# Patient Record
Sex: Female | Born: 1950 | Race: White | Hispanic: No | Marital: Married | Smoking: Never smoker
Health system: Southern US, Community
[De-identification: ages and names within clinical notes are randomized; demographics above are authoritative.]

## PROBLEM LIST (undated history)

## (undated) DIAGNOSIS — C50919 Malignant neoplasm of unspecified site of unspecified female breast: Secondary | ICD-10-CM

## (undated) DIAGNOSIS — I10 Essential (primary) hypertension: Secondary | ICD-10-CM

## (undated) HISTORY — PX: BREAST LUMPECTOMY: SHX2

## (undated) HISTORY — PX: APPENDECTOMY: SHX54

---

## 2015-05-07 ENCOUNTER — Ambulatory Visit
Admission: EM | Admit: 2015-05-07 | Discharge: 2015-05-07 | Disposition: A | Discharge status: Home / Self Care | Payer: No Typology Code available for payment source | Attending: Internal Medicine | Admitting: Internal Medicine

## 2015-05-07 ENCOUNTER — Encounter: Payer: Self-pay | Admitting: Emergency Medicine

## 2015-05-07 ENCOUNTER — Ambulatory Visit: Payer: No Typology Code available for payment source

## 2015-05-07 DIAGNOSIS — Y9301 Activity, walking, marching and hiking: Secondary | ICD-10-CM | POA: Diagnosis not present

## 2015-05-07 DIAGNOSIS — Z853 Personal history of malignant neoplasm of breast: Secondary | ICD-10-CM | POA: Diagnosis not present

## 2015-05-07 DIAGNOSIS — S93601A Unspecified sprain of right foot, initial encounter: Secondary | ICD-10-CM | POA: Diagnosis not present

## 2015-05-07 DIAGNOSIS — E785 Hyperlipidemia, unspecified: Secondary | ICD-10-CM | POA: Diagnosis not present

## 2015-05-07 DIAGNOSIS — I10 Essential (primary) hypertension: Secondary | ICD-10-CM | POA: Insufficient documentation

## 2015-05-07 DIAGNOSIS — M79671 Pain in right foot: Secondary | ICD-10-CM | POA: Diagnosis present

## 2015-05-07 HISTORY — DX: Malignant neoplasm of unspecified site of unspecified female breast: C50.919

## 2015-05-07 HISTORY — DX: Essential (primary) hypertension: I10

## 2015-05-07 MED ORDER — HYDROCODONE-ACETAMINOPHEN 5-325 MG PO TABS: 2.0000 | ORAL_TABLET | ORAL | Status: AC | PRN

## 2015-05-07 MED ORDER — INDOMETHACIN 25 MG PO CAPS: 50.0000 mg | ORAL_CAPSULE | Freq: Three times a day (TID) | ORAL | Status: AC | PRN

## 2015-05-07 NOTE — Discharge Instructions (Signed)
Foot pain seems most like a sprain, but cannot completely [place] out possibility of gout triggered by minor injury. Prescriptions for indocin (anti inflammatory) and small number of vicodin given.   Wear boot for comfort. Recheck if not improving in the next week or so. Xray of the foot was negative for fracture today.  Foot Sprain The muscles and cord like structures which attach muscle to bone (tendons) that surround the feet are made up of units. A foot sprain can occur at the weakest spot in any of these units. This condition is most often caused by injury to or overuse of the foot, as from playing contact sports, or aggravating a previous injury, or from poor conditioning, or obesity. SYMPTOMS  Pain with movement of the foot.  Tenderness and swelling at the injury site.  Loss of strength is present in moderate or severe sprains. THE THREE GRADES OR SEVERITY OF FOOT SPRAIN ARE:  Mild (Grade I): Slightly pulled muscle without tearing of muscle or tendon fibers or loss of strength.  Moderate (Grade II): Tearing of fibers in a muscle, tendon, or at the attachment to bone, with small decrease in strength.  Severe (Grade III): Rupture of the muscle-tendon-bone attachment, with separation of fibers. Severe sprain requires surgical repair. Often repeating (chronic) sprains are caused by overuse. Sudden (acute) sprains are caused by direct injury or over-use. DIAGNOSIS  Diagnosis of this condition is usually by your own observation. If problems continue, a caregiver may be required for further evaluation and treatment. X-rays may be required to make sure there are not breaks in the bones (fractures) present. Continued problems may require physical therapy for treatment. PREVENTION  Use strength and conditioning exercises appropriate for your sport.  Warm up properly prior to working out.  Use athletic shoes that are made for the sport you are participating in.  Allow adequate time for  healing. Early return to activities makes repeat injury more likely, and can lead to an unstable arthritic foot that can result in prolonged disability. Mild sprains generally heal in 3 to 10 days, with moderate and severe sprains taking 2 to 10 weeks. Your caregiver can help you determine the proper time required for healing. HOME CARE INSTRUCTIONS   Apply ice to the injury for 15-20 minutes, 03-04 times per day. Put the ice in a plastic bag and place a towel between the bag of ice and your skin.  An elastic wrap (like an Ace bandage) may be used to keep swelling down.  Keep foot above the level of the heart, or at least raised on a footstool, when swelling and pain are present.  Try to avoid use other than gentle range of motion while the foot is painful. Do not resume use until instructed by your caregiver. Then begin use gradually, not increasing use to the point of pain. If pain does develop, decrease use and continue the above measures, gradually increasing activities that do not cause discomfort, until you gradually achieve normal use.  Use crutches if and as instructed, and for the length of time instructed.  Keep injured foot and ankle wrapped between treatments.  Massage foot and ankle for comfort and to keep swelling down. Massage from the toes up towards the knee.  Only take over-the-counter or prescription medicines for pain, discomfort, or fever as directed by your caregiver. SEEK IMMEDIATE MEDICAL CARE IF:   Your pain and swelling increase, or pain is not controlled with medications.  You have loss of feeling in your  foot or your foot turns cold or blue.  You develop new, unexplained symptoms, or an increase of the symptoms that brought you to your caregiver. MAKE SURE YOU:   Understand these instructions.  Will watch your condition.  Will get help right away if you are not doing well or get worse. Document Released: 04/10/2002 Document Revised: 01/11/2012 Document  Reviewed: 06/07/2008 [facility] Patient Information 2015 [place], [place]. This information is not intended to replace advice given to you by your health care provider. Make sure you discuss any questions you have with your health care provider.

## 2015-05-07 NOTE — ED Notes (Signed)
Patient states that hesterday while walking her dog she stepped in a hole and has pain on the top of her right foot and swelling.

## 2015-05-10 NOTE — ED Provider Notes (Addendum)
CSN: [id number]     Arrival date & time 05/07/15  0816 History   First MD Initiated Contact with Patient 05/07/15 [phone]     Chief Complaint  Patient presents with  . Foot Pain    right foot   HPI  Patient is a 64 year old lady with past medical history notable for hypertension and hyperlipidemia. She was walking her dog yesterday, and stepped in a hole, slightly twisting her right foot. It didn't hurt at the time, she was able to walk on it, but she awakened in the night with swelling and discomfort. The dorsal foot today is red and puffy.   Past Medical History  Diagnosis Date  . Hypertension   . Breast cancer    hyperlipidemia  Past Surgical History  Procedure Laterality Date  . Breast lumpectomy Left   . Appendectomy     History reviewed. No pertinent family history. History  Substance Use Topics  . Smoking status: Never Smoker   . Smokeless tobacco: Never Used  . Alcohol Use: Yes    Review of Systems  All other systems reviewed and are negative.   Allergies  Erythromycin and Prilosec  Home Medications   Prior to Admission medications   Medication Sig Start Date End Date Taking? Authorizing Provider  amLODipine (NORVASC) 5 MG tablet Take 5 mg by mouth daily.   Yes Historical Provider, MD  atorvastatin (LIPITOR) 20 MG tablet Take 20 mg by mouth daily.   Yes Historical Provider, MD  metoprolol succinate (TOPROL-XL) 25 MG 24 hr tablet Take 25 mg by mouth daily.   Yes Historical Provider, MD  HYDROcodone-acetaminophen (NORCO/VICODIN) 5-325 MG per tablet Take 2 tablets by mouth every 4 (four) hours as needed. 05/07/15   Sherlene Shams, MD  indomethacin (INDOCIN) 25 MG capsule Take 2 capsules (50 mg total) by mouth 3 (three) times daily as needed. Up to 2 capsules tid as needed for pain in foot. 05/07/15   Sherlene Shams, MD   BP 141/68 mmHg  Pulse 79  Temp(Src) 98 F (36.7 C) (Tympanic)  Resp 16  Ht 5' 0.5" (1.537 m)  Wt 123 lb (55.792 kg)  BMI 23.62 kg/m2  SpO2  100% Physical Exam  Constitutional: She is oriented to person, place, and time. No distress.  Alert, nicely groomed  HENT:  Head: Atraumatic.  Eyes:  Conjugate gaze, no eye redness/drainage  Neck: Neck supple.  Cardiovascular: Normal rate.   Pulmonary/Chest: No respiratory distress.  Abdominal: She exhibits no distension.  Musculoskeletal: Normal range of motion.  Right dorsal foot/ankle is puffy, red, and warmer than the left foot. She has good range of motion at the ankle, but it's uncomfortable to move her ankle because of the swelling. Skin intact. Did not appreciate bruising. No focal bony tenderness  Neurological: She is alert and oriented to person, place, and time.  Skin: Skin is warm and dry.  No cyanosis  Nursing note and vitals reviewed.   ED Course  Procedures   Imaging Review Right foot x-rays reviewed, with radiologist's report that they are without acute findings. Mild degeneration at the first MTP is noted.  Boot orthosis applied to the right foot and ankle at the urgent care, to provide mild compression and to facilitate ambulation.  MDM   1. Right foot sprain, initial encounter    Suspicion for gout, given history of apparently minor injury with subsequent development of swelling and redness and pain, in a patient with hypertension. Prescriptions for indomethacin and a small  number of Vicodin (6 tablets) were given. Recheck or follow up PCP Dr. Vickki Muff at [facility] primary care if not improving in a few days.   Sherlene Shams, MD 05/10/15 1257  Sherlene Shams, MD 05/10/15 [phone]

## 2016-07-07 IMAGING — CR DG FOOT COMPLETE 3+V*R*
3 series · 3 of 3 positions shown · non-contrast
Comparison: None.

CLINICAL DATA: Stepped in hole yesterday with pain over the dorsum
of the foot and swelling

EXAM:
RIGHT FOOT COMPLETE - 3+ VIEW

[foot ap]
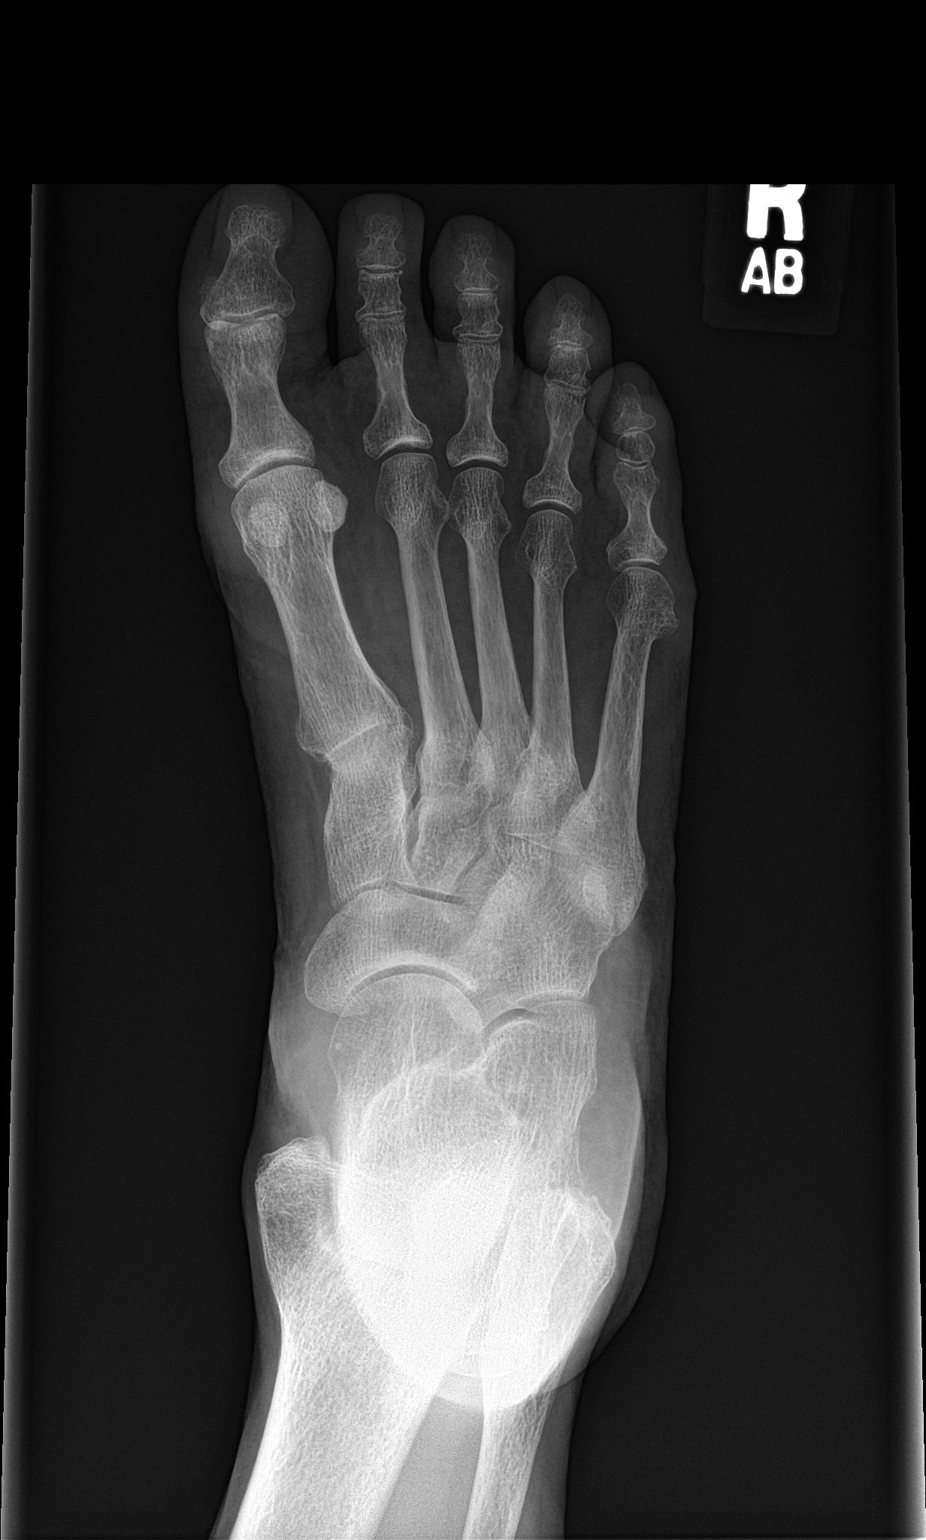

[foot obl]
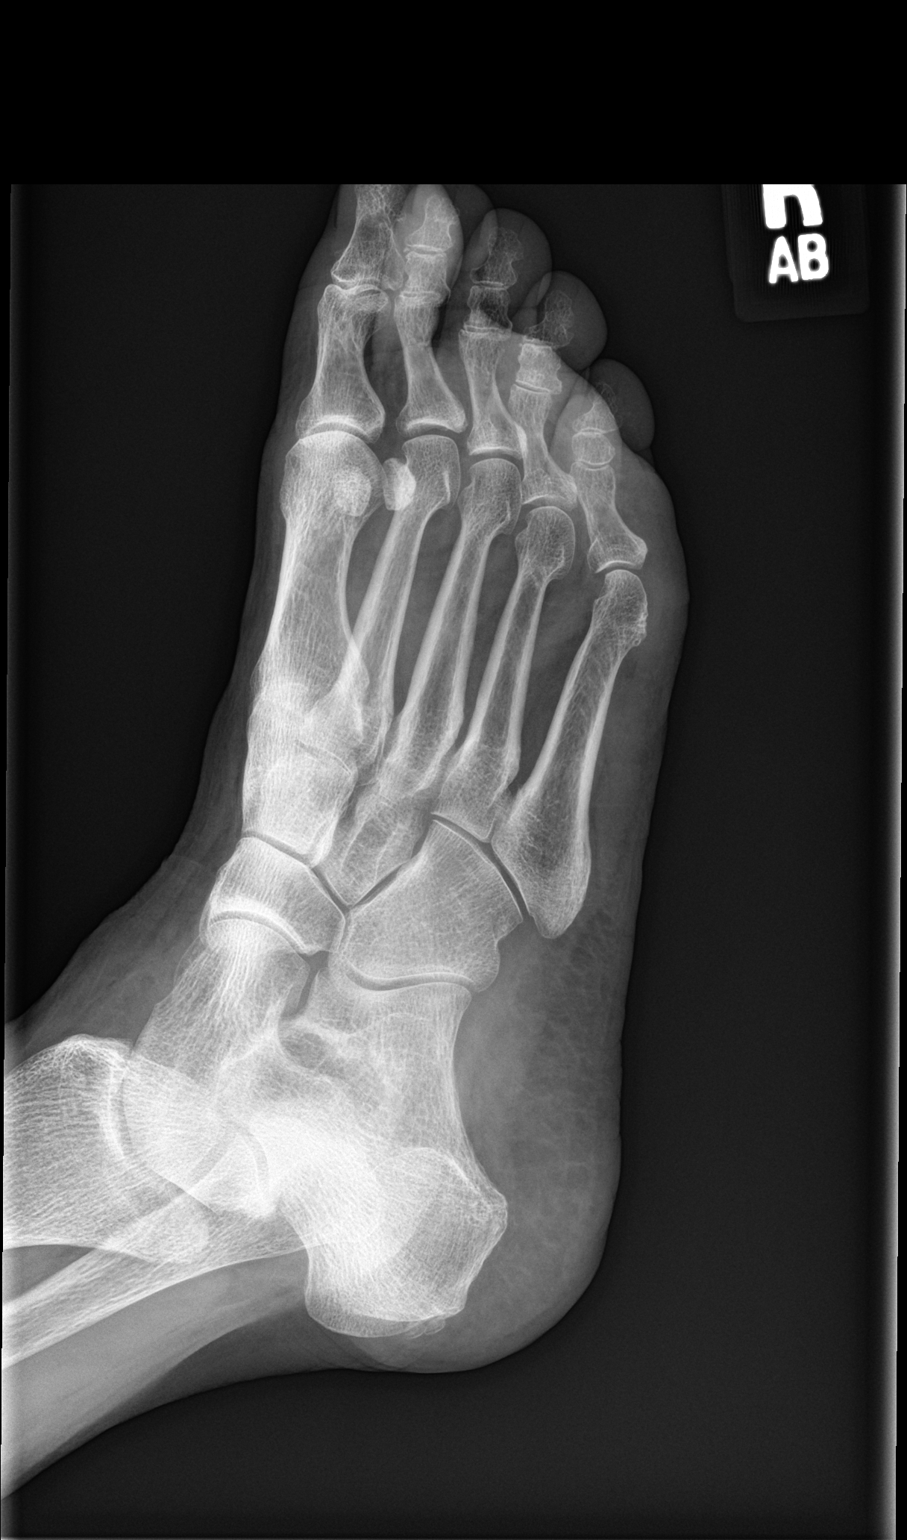

[foot lat]
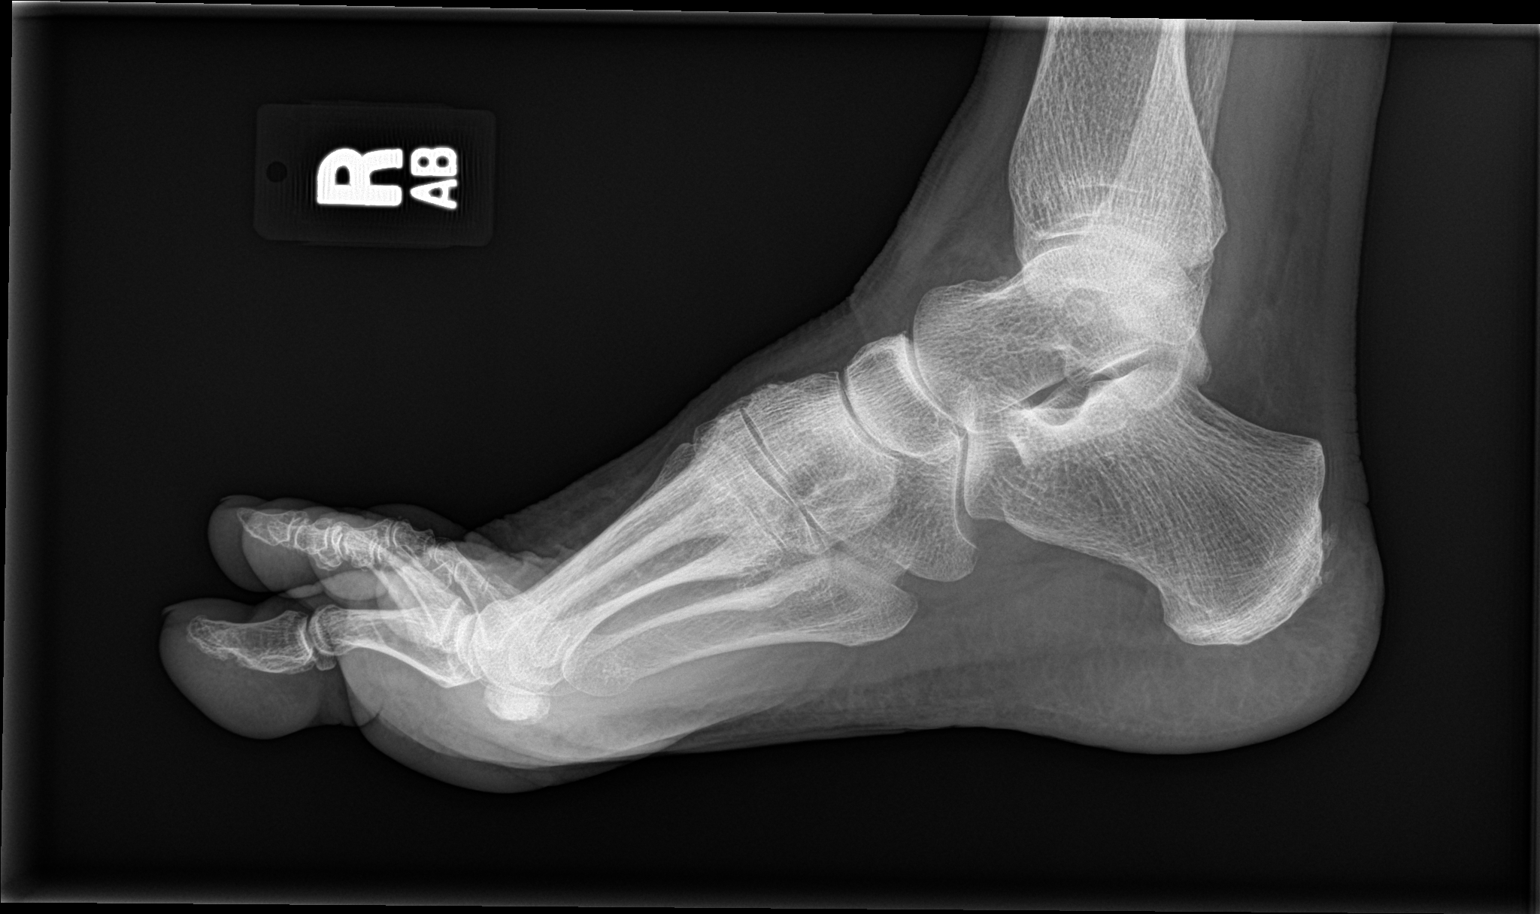

[3 of 3 positions shown; findings below may reference images not displayed]

FINDINGS: Tarsal-metatarsal alignment is normal. No fracture is seen. Only
mild degenerative change of the right first MTP joint is present. No
erosion is seen.
IMPRESSION: No acute abnormality.

## 2022-01-29 ENCOUNTER — Ambulatory Visit (INDEPENDENT_AMBULATORY_CARE_PROVIDER_SITE_OTHER): Payer: Medicare Other

## 2022-01-29 ENCOUNTER — Other Ambulatory Visit: Payer: Self-pay

## 2022-01-29 ENCOUNTER — Ambulatory Visit
Admission: EM | Admit: 2022-01-29 | Discharge: 2022-01-29 | Disposition: A | Discharge status: Home / Self Care | Payer: Medicare Other | Attending: Emergency Medicine | Admitting: Emergency Medicine

## 2022-01-29 DIAGNOSIS — S42291A Other displaced fracture of upper end of right humerus, initial encounter for closed fracture: Secondary | ICD-10-CM

## 2022-01-29 DIAGNOSIS — M79621 Pain in right upper arm: Secondary | ICD-10-CM

## 2022-01-29 DIAGNOSIS — W19XXXA Unspecified fall, initial encounter: Secondary | ICD-10-CM

## 2022-01-29 MED ORDER — ACETAMINOPHEN 500 MG PO TABS
1000.0000 mg | ORAL_TABLET | Freq: Once | ORAL | Status: AC
  Administered 2022-01-29: 1000 mg via ORAL

## 2022-01-29 MED ORDER — TRAMADOL HCL 50 MG PO TABS: 50.0000 mg | ORAL_TABLET | Freq: Four times a day (QID) | ORAL | 0 refills | Status: AC | PRN

## 2022-01-29 NOTE — Discharge Instructions (Addendum)
Spoke to ortho at [facility] they will see you today 3 ?Take pain meds as needed. Do not take the norco and tramadol together.  ?Can use ice as needed for pain  ?Wear the splint until seen  ? ?

## 2022-01-29 NOTE — ED Triage Notes (Signed)
Pt is here aftwe falling on her right shoulder from a toy on the floor. Pt has taken Tylenol to relieve discomfort, she last took Tylenol 6pm yesterday.  ?

## 2022-01-29 NOTE — ED Provider Notes (Signed)
?[place] ? ? ? ?CSN: [id number] ?Arrival date & time: 01/29/22  9242 ? ? ?  ? ?History   ?Chief Complaint ?Chief Complaint  ?Patient presents with  ? Shoulder Injury  ? ? ?HPI ?Nancy Walton is a 71 y.o. female.  ? ?Patient presents today from a fall tripping over her dog toy yesterday.  Patient has pain immobility and bruising to the right upper arm.  Patient did have surgery on this same area approximately 1 year ago for rotator cuff surgery.  Patient did call her orthopedic in [place] and they are not able to see her until March.  Patient has not taken anything prior to arrival.  Denies any loss of consciousness no rib pain no shortness of breath no nausea vomiting or diarrhea. ? ? ?Past Medical History:  ?Diagnosis Date  ? Breast cancer ([place])   ? Hypertension   ? ? ?There are no problems to display for this patient. ? ? ?Past Surgical History:  ?Procedure Laterality Date  ? APPENDECTOMY    ? BREAST LUMPECTOMY Left   ? ? ?OB History   ?No obstetric history on file. ?  ? ? ? ?Home Medications   ? ?Prior to Admission medications   ?Medication Sig Start Date End Date Taking? Authorizing Provider  ?traMADol (ULTRAM) 50 MG tablet Take 1 tablet (50 mg total) by mouth every 6 (six) hours as needed. 01/29/22  Yes Marney Setting, NP  ?amLODipine (NORVASC) 5 MG tablet Take 5 mg by mouth daily.    [provider]  ?atorvastatin (LIPITOR) 20 MG tablet Take 20 mg by mouth daily.    [provider]  ?HYDROcodone-acetaminophen (NORCO/VICODIN) 5-325 MG per tablet Take 2 tablets by mouth every 4 (four) hours as needed. 05/07/15   Wynona Luna, MD  ?indomethacin (INDOCIN) 25 MG capsule Take 2 capsules (50 mg total) by mouth 3 (three) times daily as needed. Up to 2 capsules tid as needed for pain in foot. 05/07/15   Wynona Luna, MD  ?metoprolol succinate (TOPROL-XL) 25 MG 24 hr tablet Take 25 mg by mouth daily.    [provider]  ? ? ?Family History ?Family History  ?Problem  Relation Age of Onset  ? Heart failure Mother   ? Alzheimer's disease Father   ? ? ?Social History ?Social History  ? ?Tobacco Use  ? Smoking status: Never  ? Smokeless tobacco: Never  ?Substance Use Topics  ? Alcohol use: Yes  ? Drug use: No  ? ? ? ?Allergies   ?Erythromycin and Prilosec [omeprazole] ? ? ?Review of Systems ?Review of Systems  ?Constitutional:  Negative for fever.  ?Respiratory: Negative.    ?Cardiovascular: Negative.   ?Gastrointestinal: Negative.   ?Musculoskeletal:   ?     Pain to right upper arm upon palpation not able to lift or move right shoulder area.  ?Skin:   ?     Large amount of bruising noted to right upper arm slight swelling to right hand  ?Neurological: Negative.   ? ? ?Physical Exam ?Triage Vital Signs ?ED Triage Vitals  ?Enc Vitals Group  ?   BP 01/29/22 0831 133/76  ?   Pulse Rate 01/29/22 0831 (!) 108  ?   Resp 01/29/22 0831 17  ?   Temp 01/29/22 0831 99.3 ?F (37.4 ?C)  ?   Temp Source 01/29/22 0831 Oral  ?   SpO2 01/29/22 0831 100 %  ?   Weight --   ?  Height --   ?   Head Circumference --   ?   Peak Flow --   ?   Pain Score 01/29/22 0829 10  ?   Pain Loc --   ?   Pain Edu? --   ?   Excl. in [place]? --   ? ?No data found. ? ?Updated Vital Signs ?BP 133/76 (BP Location: Left Arm)   Pulse (!) 108   Temp 99.3 ?F (37.4 ?C) (Oral)   Resp 17   SpO2 100%  ? ?Visual Acuity ?Right Eye Distance:   ?Left Eye Distance:   ?Bilateral Distance:   ? ?Right Eye Near:   ?Left Eye Near:    ?Bilateral Near:    ? ?Physical Exam ?Cardiovascular:  ?   Rate and Rhythm: Normal rate.  ?Pulmonary:  ?   Effort: Pulmonary effort is normal.  ?Abdominal:  ?   General: Abdomen is flat.  ?Musculoskeletal:     ?   General: Swelling, tenderness and signs of injury present.  ?   Cervical back: Normal range of motion.  ?   Comments: Decreased range of motion to right humerus area.  Large significant amount of purple appearance bruising.  +1 pitting edema to right hand.  Strong pedal pulses  ?Skin: ?   Capillary  Refill: Capillary refill takes 2 to 3 seconds.  ?   Findings: Bruising present.  ?Neurological:  ?   General: No focal deficit present.  ?   Mental Status: She is alert.  ? ? ? ?UC Treatments / Results  ?Labs ?(all labs ordered are listed, but only abnormal results are displayed) ?Labs Reviewed - No data to display ? ?EKG ? ? ?Radiology ?DG Shoulder Right ? ?Result Date: 01/29/2022 ?CLINICAL DATA:  Fall. EXAM: RIGHT SHOULDER - 2+ VIEW COMPARISON:  None. FINDINGS: Moderately displaced and comminuted proximal right humeral neck fracture is noted. Ribs are unremarkable. No dislocation is noted. IMPRESSION: Moderately displaced and comminuted proximal right humeral neck fracture. Electronically Signed   By: Marijo Conception M.D.   On: 01/29/2022 09:10  ? ?DG Humerus Right ? ?Result Date: 01/29/2022 ?CLINICAL DATA:  Fall. EXAM: RIGHT HUMERUS - 2+ VIEW COMPARISON:  None. FINDINGS: Moderately displaced and comminuted fracture is seen involving the proximal right humeral neck. IMPRESSION: Moderately displaced and comminuted proximal right humeral neck fracture. Electronically Signed   By: Marijo Conception M.D.   On: 01/29/2022 09:09   ? ?Procedures ?Procedures (including critical care time) ? ?Medications Ordered in UC ?Medications  ?acetaminophen (TYLENOL) tablet 1,000 mg (1,000 mg Oral Given 01/29/22 0940)  ? ? ?Initial Impression / Assessment and Plan / UC Course  ?I have reviewed the triage vital signs and the nursing notes. ? ?Pertinent labs & imaging results that were available during my care of the patient were reviewed by me and considered in my medical decision making (see chart for details). ? ?  ? ?Called spoke with [facility] per pts request   ?Sling placed per physician's request patient is to see them in the office today at 3:00.  Unable to place images on a disc but did print them out for patient to give to orthopedic office.  Expressed to patient and family member at bedside patient may need surgery orthopedic  will determine this. ?Take pain medicine as needed do not take the Norco if you have any obvious with the tramadol. ?Apply ice as needed every 2-4 hours to help with discomfort ? ?Final Clinical Impressions(s) /  UC Diagnoses  ? ?Final diagnoses:  ?Other closed displaced fracture of proximal end of right humerus, initial encounter  ? ? ? ?Discharge Instructions   ? ?  ?Spoke to ortho at [facility] they will see you today 3 ?Take pain meds as needed. Do not take the norco and tramadol together.  ?Can use ice as needed for pain  ?Wear the splint until seen  ? ? ? ? ? ?ED Prescriptions   ? ? Medication Sig Dispense Auth. Provider  ? traMADol (ULTRAM) 50 MG tablet Take 1 tablet (50 mg total) by mouth every 6 (six) hours as needed. 15 tablet Marney Setting, NP  ? ?  ? ?I have reviewed the PDMP during this encounter. ?  ?Marney Setting, NP ?01/29/22 1024 ? ?

## 2023-04-01 IMAGING — CR DG HUMERUS 2V *R*
2 series · 2 of 2 positions shown · non-contrast
Comparison: None.

CLINICAL DATA: Fall.

EXAM:
RIGHT HUMERUS - 2+ VIEW

[humerus ap]
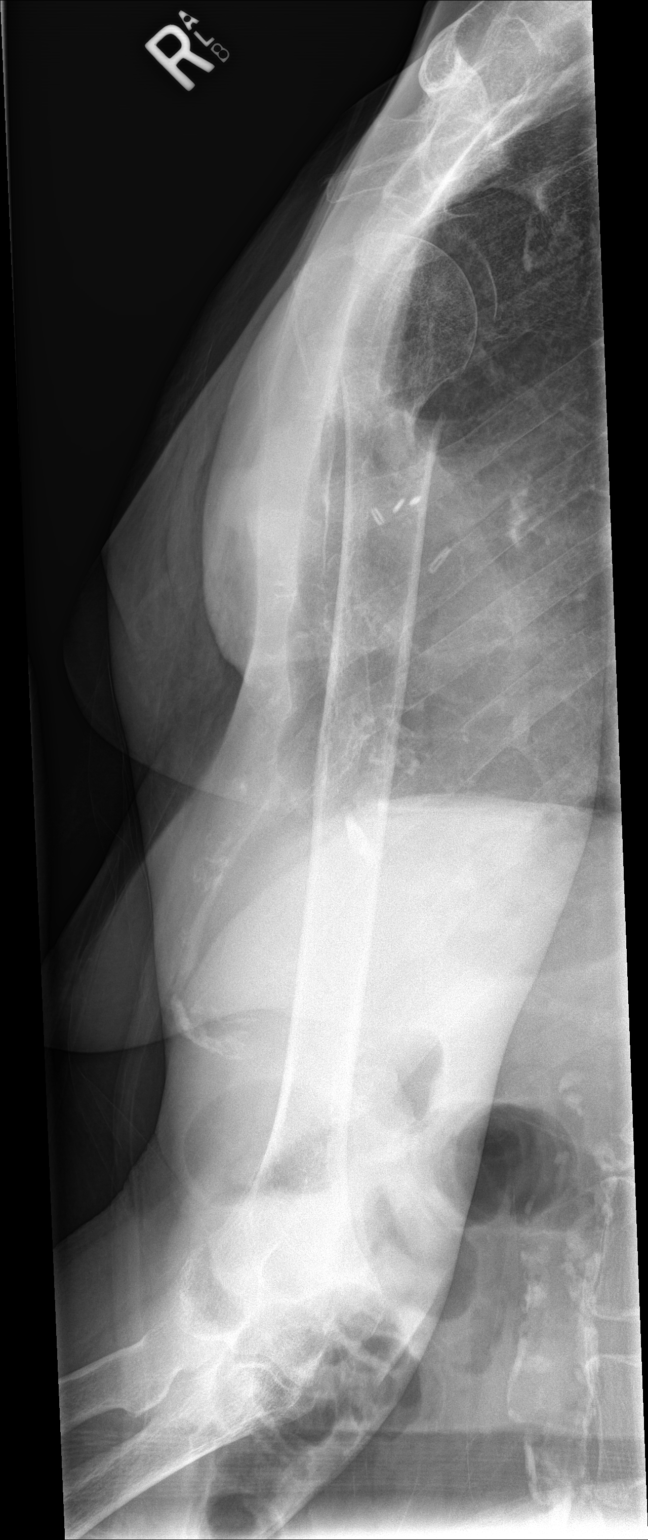

[humerus lat]
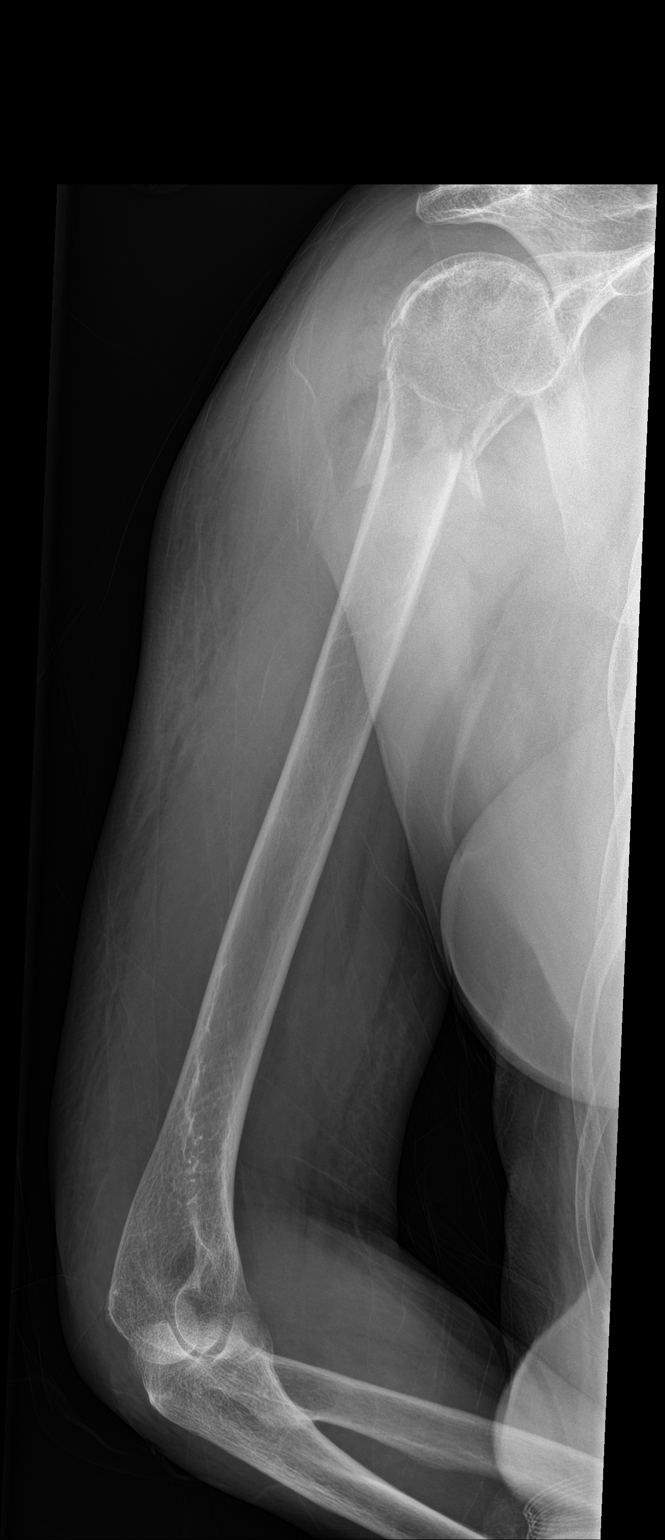

[2 of 2 positions shown; findings below may reference images not displayed]

FINDINGS: Moderately displaced and comminuted fracture is seen involving the
proximal right humeral neck.
IMPRESSION: Moderately displaced and comminuted proximal right humeral neck
fracture.

## 2023-04-01 IMAGING — CR DG SHOULDER 2+V*R*
3 series · 3 of 3 positions shown · non-contrast
Comparison: None.

CLINICAL DATA: Fall.

EXAM:
RIGHT SHOULDER - 2+ VIEW

[shoulder grashey]
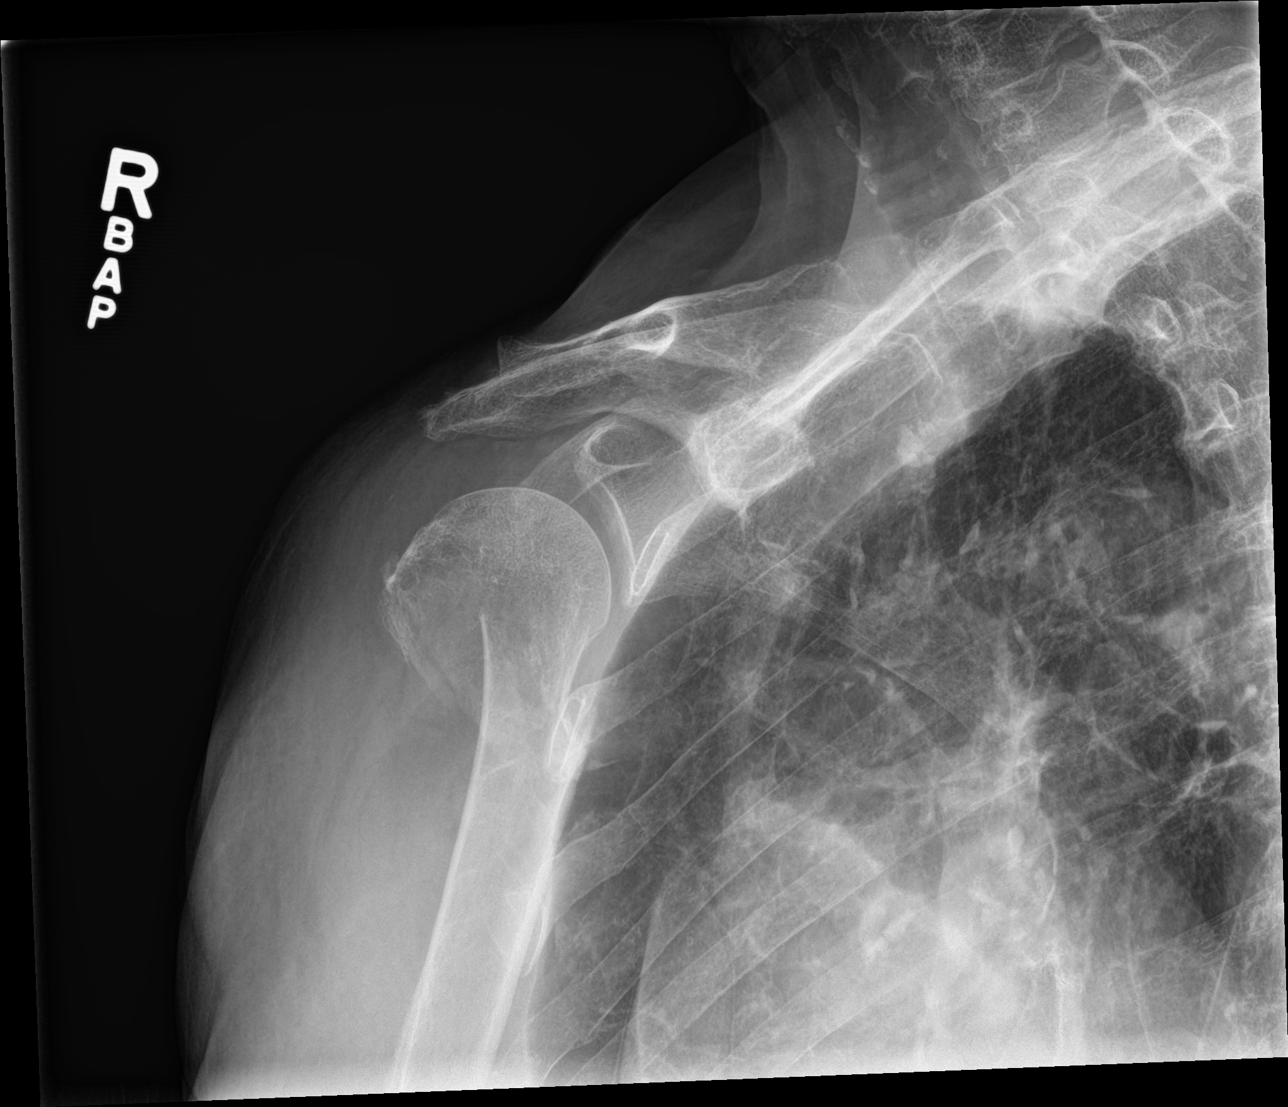

[shoulder y view]
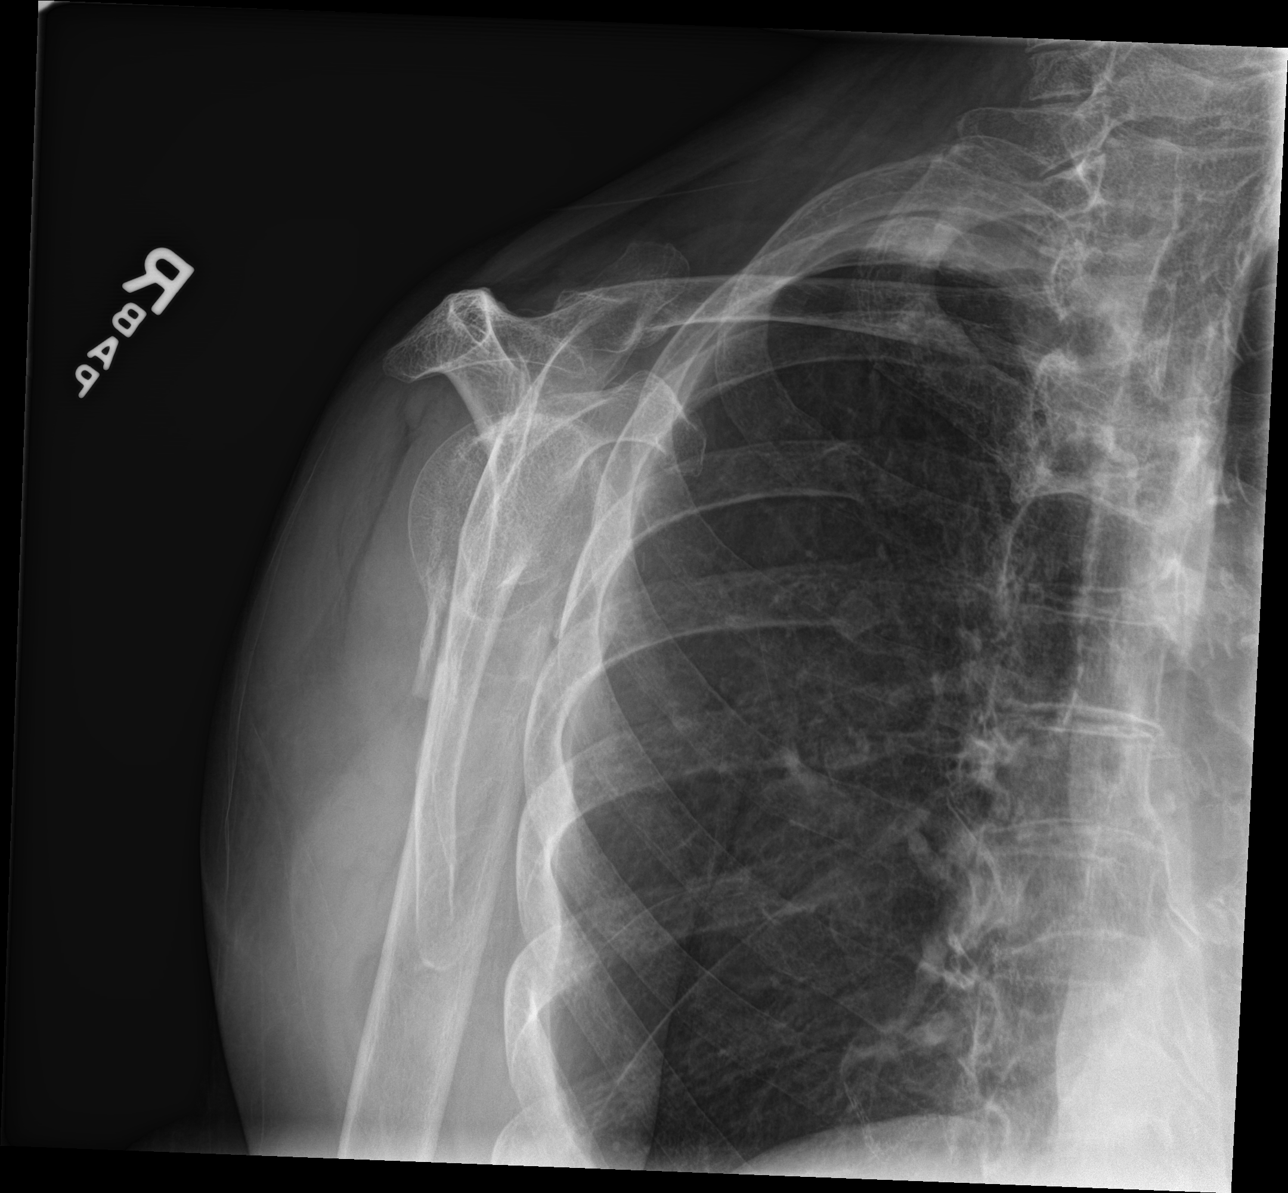

[shoulder axial]
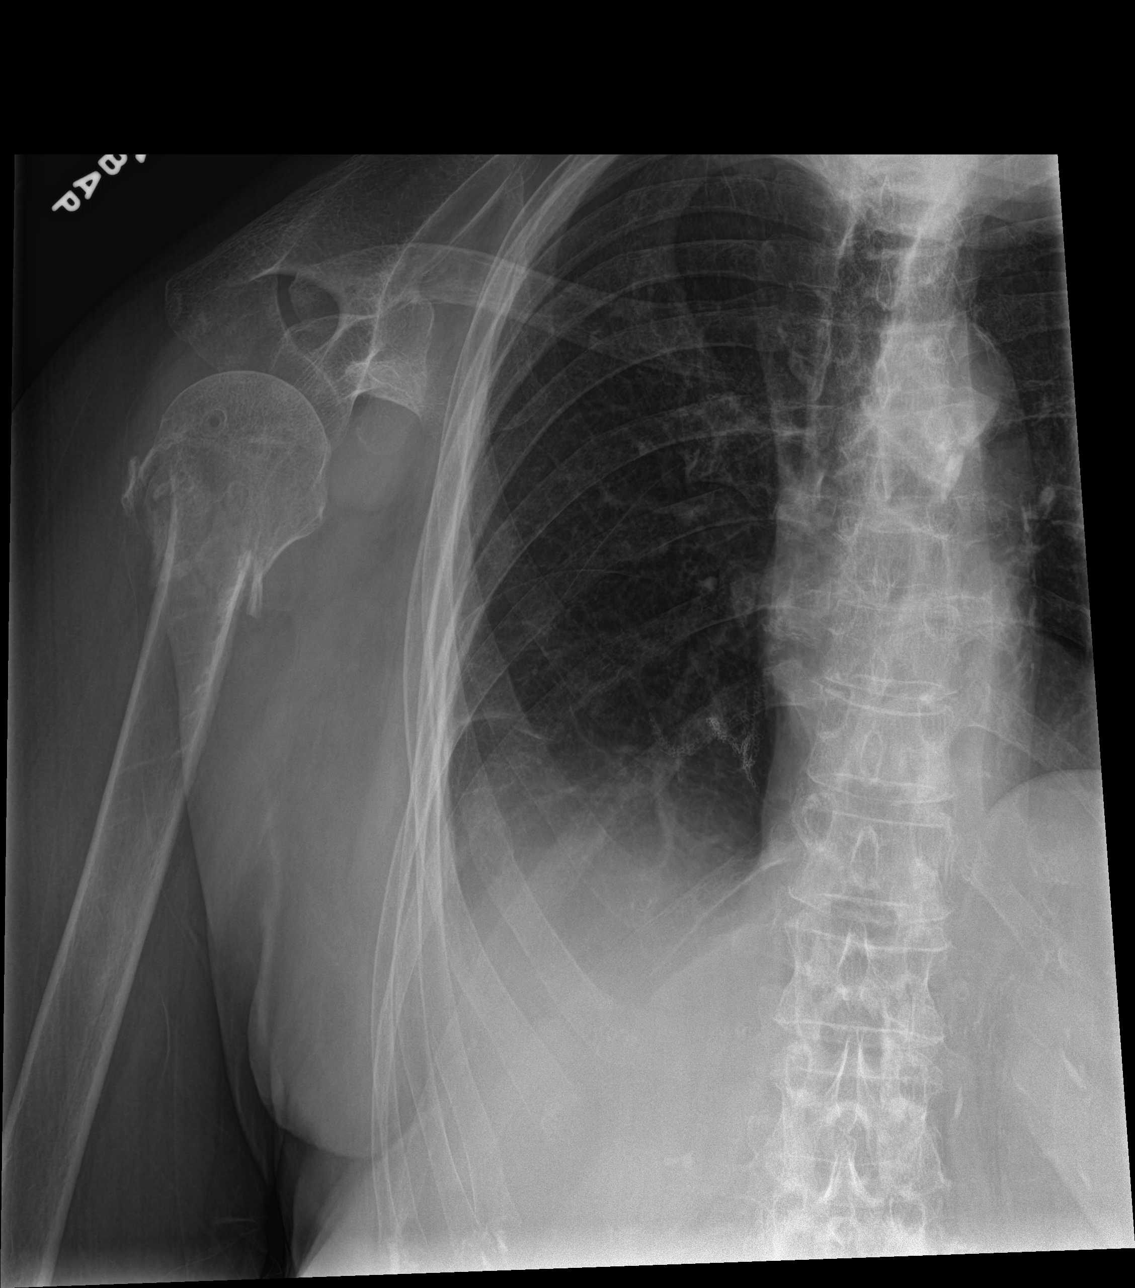

[3 of 3 positions shown; findings below may reference images not displayed]

FINDINGS: Moderately displaced and comminuted proximal right humeral neck
fracture is noted. Ribs are unremarkable. No dislocation is noted.
IMPRESSION: Moderately displaced and comminuted proximal right humeral neck
fracture.

## 2024-01-01 DEATH — deceased
